# Patient Record
Sex: Female | Born: 1975 | Hispanic: No | Marital: Single | Smoking: Never smoker
Health system: Southern US, Community
[De-identification: ages and names within clinical notes are randomized; demographics above are authoritative.]

## PROBLEM LIST (undated history)

## (undated) DIAGNOSIS — E7800 Pure hypercholesterolemia, unspecified: Secondary | ICD-10-CM

## (undated) DIAGNOSIS — E119 Type 2 diabetes mellitus without complications: Secondary | ICD-10-CM

## (undated) DIAGNOSIS — J189 Pneumonia, unspecified organism: Secondary | ICD-10-CM

---

## 2000-02-28 ENCOUNTER — Ambulatory Visit (HOSPITAL_COMMUNITY): Admission: RE | Admit: 2000-02-28 | Discharge: 2000-02-28 | Payer: Self-pay | Admitting: *Deleted

## 2000-05-19 ENCOUNTER — Inpatient Hospital Stay (HOSPITAL_COMMUNITY): Admission: AD | Admit: 2000-05-19 | Discharge: 2000-05-19 | Payer: Self-pay | Admitting: *Deleted

## 2000-05-20 ENCOUNTER — Inpatient Hospital Stay (HOSPITAL_COMMUNITY): Admission: AD | Admit: 2000-05-20 | Discharge: 2000-05-20 | Payer: Self-pay | Admitting: *Deleted

## 2000-06-03 ENCOUNTER — Inpatient Hospital Stay (HOSPITAL_COMMUNITY): Admission: AD | Admit: 2000-06-03 | Discharge: 2000-06-06 | Payer: Self-pay | Admitting: Obstetrics

## 2001-01-08 ENCOUNTER — Other Ambulatory Visit: Admission: RE | Admit: 2001-01-08 | Discharge: 2001-01-08 | Payer: Self-pay | Admitting: Obstetrics and Gynecology

## 2001-02-15 ENCOUNTER — Encounter: Payer: Self-pay | Admitting: Obstetrics and Gynecology

## 2001-02-15 ENCOUNTER — Ambulatory Visit (HOSPITAL_COMMUNITY): Admission: RE | Admit: 2001-02-15 | Discharge: 2001-02-15 | Payer: Self-pay | Admitting: Obstetrics and Gynecology

## 2001-08-02 ENCOUNTER — Emergency Department (HOSPITAL_COMMUNITY): Admission: EM | Admit: 2001-08-02 | Discharge: 2001-08-02 | Payer: Self-pay

## 2001-08-02 ENCOUNTER — Encounter: Payer: Self-pay | Admitting: Emergency Medicine

## 2001-08-20 ENCOUNTER — Encounter: Admission: RE | Admit: 2001-08-20 | Discharge: 2001-09-25 | Payer: Self-pay | Admitting: Orthopaedic Surgery

## 2002-04-17 ENCOUNTER — Encounter: Payer: Self-pay | Admitting: Emergency Medicine

## 2002-04-17 ENCOUNTER — Emergency Department (HOSPITAL_COMMUNITY): Admission: EM | Admit: 2002-04-17 | Discharge: 2002-04-17 | Payer: Self-pay | Admitting: Emergency Medicine

## 2002-11-13 HISTORY — PX: TUBAL LIGATION: SHX77

## 2003-01-07 ENCOUNTER — Emergency Department (HOSPITAL_COMMUNITY): Admission: EM | Admit: 2003-01-07 | Discharge: 2003-01-08 | Payer: Self-pay | Admitting: Emergency Medicine

## 2003-01-08 ENCOUNTER — Encounter: Payer: Self-pay | Admitting: Emergency Medicine

## 2003-05-27 ENCOUNTER — Ambulatory Visit (HOSPITAL_COMMUNITY): Admission: RE | Admit: 2003-05-27 | Discharge: 2003-05-27 | Payer: Self-pay | Admitting: Obstetrics & Gynecology

## 2003-05-27 ENCOUNTER — Encounter: Payer: Self-pay | Admitting: Obstetrics & Gynecology

## 2003-07-12 ENCOUNTER — Inpatient Hospital Stay (HOSPITAL_COMMUNITY): Admission: AD | Admit: 2003-07-12 | Discharge: 2003-07-12 | Payer: Self-pay | Admitting: Obstetrics

## 2003-07-14 ENCOUNTER — Inpatient Hospital Stay (HOSPITAL_COMMUNITY): Admission: AD | Admit: 2003-07-14 | Discharge: 2003-07-17 | Payer: Self-pay | Admitting: Obstetrics

## 2011-11-14 DIAGNOSIS — J189 Pneumonia, unspecified organism: Secondary | ICD-10-CM

## 2011-11-14 HISTORY — DX: Pneumonia, unspecified organism: J18.9

## 2012-10-04 ENCOUNTER — Emergency Department (HOSPITAL_COMMUNITY)
Admission: EM | Admit: 2012-10-04 | Discharge: 2012-10-04 | Disposition: A | Discharge status: Home / Self Care | Payer: BC Managed Care – PPO | Source: Home / Self Care | Attending: Emergency Medicine | Admitting: Emergency Medicine

## 2012-10-04 ENCOUNTER — Encounter (HOSPITAL_COMMUNITY): Payer: Self-pay | Admitting: Emergency Medicine

## 2012-10-04 DIAGNOSIS — J45909 Unspecified asthma, uncomplicated: Secondary | ICD-10-CM

## 2012-10-04 HISTORY — DX: Type 2 diabetes mellitus without complications: E11.9

## 2012-10-04 HISTORY — DX: Pure hypercholesterolemia, unspecified: E78.00

## 2012-10-04 MED ORDER — ALBUTEROL SULFATE HFA 108 (90 BASE) MCG/ACT IN AERS: 1.0000 | INHALATION_SPRAY | Freq: Four times a day (QID) | RESPIRATORY_TRACT | Status: DC | PRN

## 2012-10-04 MED ORDER — PREDNISONE 20 MG PO TABS: 40.0000 mg | ORAL_TABLET | Freq: Every day | ORAL | Status: AC

## 2012-10-04 MED ORDER — PREDNISONE 20 MG PO TABS
40.0000 mg | ORAL_TABLET | Freq: Once | ORAL | Status: AC
  Administered 2012-10-04: 40 mg via ORAL

## 2012-10-04 MED ORDER — ALBUTEROL SULFATE (5 MG/ML) 0.5% IN NEBU
INHALATION_SOLUTION | RESPIRATORY_TRACT | Status: AC
  Filled 2012-10-04: qty 1

## 2012-10-04 MED ORDER — CETIRIZINE HCL 10 MG PO CAPS: 1.0000 | ORAL_CAPSULE | Freq: Every day | ORAL | Status: DC

## 2012-10-04 MED ORDER — PREDNISONE 20 MG PO TABS
ORAL_TABLET | ORAL | Status: AC
  Filled 2012-10-04: qty 2

## 2012-10-04 MED ORDER — ALBUTEROL SULFATE (5 MG/ML) 0.5% IN NEBU
5.0000 mg | INHALATION_SOLUTION | Freq: Once | RESPIRATORY_TRACT | Status: AC
  Administered 2012-10-04: 5 mg via RESPIRATORY_TRACT

## 2012-10-04 NOTE — ED Notes (Signed)
Adv pt to notify the front if her sx change while she's waiting.

## 2012-10-04 NOTE — ED Provider Notes (Signed)
History     CSN: [id number]  Arrival date & time 10/04/12  1041   First MD Initiated Contact with Patient 10/04/12 1219      Chief Complaint  Patient presents with  . URI    (Consider location/radiation/quality/duration/timing/severity/associated sxs/prior treatment) HPI Comments: Patient presents urgent care this morning after having felt sudden shortness of breath wheezing sore throat any nasal congestion. Describes a for the last 2 months he has had respiratory problems which she attended her Dr. for was diagnosed with bronchitis, she completed and antibiotics cycle at that point and said that somehow she felt much better after a week but her symptoms never went away completely. Patient presents to urgent care describing that this morning she feels as a result of whether change she started feeling short of breath and congested again to the point it was having difficulty breathing.  Patient is a 36 y.o. female presenting with URI. The history is provided by the patient.  URI The primary symptoms include cough and wheezing. Primary symptoms do not include fever, fatigue, headaches, nausea, arthralgias or rash. The current episode started more than 1 week ago. This is a recurrent problem. Progression since onset: In term attempt shortly.  The illness is not associated with chills, facial pain or sinus pressure.    Past Medical History  Diagnosis Date  . Diabetes mellitus without complication   . Hypercholesteremia     History reviewed. No pertinent past surgical history.  History reviewed. No pertinent family history.  History  Substance Use Topics  . Smoking status: Never Smoker   . Smokeless tobacco: Not on file  . Alcohol Use: No    OB History    Grav Para Term Preterm Abortions TAB SAB Ect Mult Living                  Review of Systems  Constitutional: Negative for fever, chills and fatigue.  HENT: Negative for sinus pressure.   Respiratory: Positive for cough  and wheezing.   Cardiovascular: Negative for chest pain, palpitations and leg swelling.  Gastrointestinal: Negative for nausea.  Musculoskeletal: Negative for arthralgias.  Skin: Negative for rash.  Neurological: Negative for dizziness and headaches.    Allergies  Review of patient's allergies indicates no known allergies.  Home Medications   Current Outpatient Rx  Name  Route  Sig  Dispense  Refill  . ROSUVASTATIN CALCIUM 10 MG PO TABS   Oral   Take 10 mg by mouth daily.         Marland Kitchen SITAGLIPTIN-METFORMIN HCL 50-1000 MG PO TABS   Oral   Take 1 tablet by mouth 2 (two) times daily with a meal.         . ALBUTEROL SULFATE HFA 108 (90 BASE) MCG/ACT IN AERS   Inhalation   Inhale 1-2 puffs into the lungs every 6 (six) hours as needed for wheezing or shortness of breath.   1 Inhaler   0   . CETIRIZINE HCL 10 MG PO CAPS   Oral   Take 1 capsule (10 mg total) by mouth daily.   30 capsule   1   . PREDNISONE 20 MG PO TABS   Oral   Take 2 tablets (40 mg total) by mouth daily. 2 tablets daily for 5 days   10 tablet   0     BP 114/75  Pulse 84  Temp 98.3 F (36.8 C) (Oral)  Resp 20  SpO2 98%  LMP 09/06/2012  Physical Exam  Nursing note and vitals reviewed. Constitutional: Vital signs are normal. She appears well-developed and well-nourished.  Non-toxic appearance. She does not have a sickly appearance. She does not appear ill. No distress.  HENT:  Right Ear: Tympanic membrane normal. No drainage or swelling.  Left Ear: Tympanic membrane normal.  Mouth/Throat: Uvula is midline. Posterior oropharyngeal erythema present. No oropharyngeal exudate, posterior oropharyngeal edema or tonsillar abscesses.  Pulmonary/Chest: Effort normal. No respiratory distress. She has no decreased breath sounds. She has wheezes. She has no rales. She exhibits no tenderness.    ED Course  Procedures (including critical care time)  Labs Reviewed - No data to display No results  found.   1. Reactive airway disease with wheezing       MDM  The symptoms and exam were most consistent with reactive airway disease. 5 document R. by 15-20 minutes just got is a i the provider.        [name], MD 10/04/12 [phone]

## 2012-10-04 NOTE — ED Notes (Signed)
Pt c/o cold sx x2 hours... Sx include: Headache, sore throat, chest discomfort when she takes deep breaths (inhale), SOB, nasal congestion... Denies: fevers, vomiting, nauseas, diarrhea, cough

## 2014-08-10 ENCOUNTER — Encounter (HOSPITAL_COMMUNITY): Payer: Self-pay | Admitting: Emergency Medicine

## 2014-08-10 ENCOUNTER — Emergency Department (INDEPENDENT_AMBULATORY_CARE_PROVIDER_SITE_OTHER)
Admission: EM | Admit: 2014-08-10 | Discharge: 2014-08-10 | Disposition: A | Discharge status: Home / Self Care | Payer: BC Managed Care – PPO | Source: Home / Self Care | Attending: Family Medicine | Admitting: Family Medicine

## 2014-08-10 DIAGNOSIS — R0982 Postnasal drip: Secondary | ICD-10-CM

## 2014-08-10 DIAGNOSIS — J0140 Acute pansinusitis, unspecified: Secondary | ICD-10-CM

## 2014-08-10 DIAGNOSIS — J018 Other acute sinusitis: Secondary | ICD-10-CM

## 2014-08-10 HISTORY — DX: Pneumonia, unspecified organism: J18.9

## 2014-08-10 LAB — POCT RAPID STREP A: STREPTOCOCCUS, GROUP A SCREEN (DIRECT): NEGATIVE

## 2014-08-10 MED ORDER — IPRATROPIUM BROMIDE 0.06 % NA SOLN: 2.0000 | Freq: Four times a day (QID) | NASAL | Status: DC

## 2014-08-10 MED ORDER — FLUTICASONE PROPIONATE 50 MCG/ACT NA SUSP: 2.0000 | Freq: Every day | NASAL | Status: DC

## 2014-08-10 MED ORDER — AMOXICILLIN-POT CLAVULANATE 875-125 MG PO TABS: 1.0000 | ORAL_TABLET | Freq: Two times a day (BID) | ORAL | Status: DC

## 2014-08-10 NOTE — ED Provider Notes (Signed)
CSN: [id number]     Arrival date & time 08/10/14  1138 History   First MD Initiated Contact with Patient 08/10/14 1311     Chief Complaint  Patient presents with  . Nasal Congestion  . Headache  . Sore Throat  . sore gums    (Consider location/radiation/quality/duration/timing/severity/associated sxs/prior Treatment) HPI  Cough and congestion: started 10 days ago. Associated w/ initially clear nasal discharge which is now green. Getting worse. No immprovement w/ rest and homeemade home regimens w/ honey and tea. Associated w/ sore throat, gum tenderness, chest congestion, sinus pressure and headache. No sick contacts.   Past Medical History  Diagnosis Date  . Diabetes mellitus without complication   . Hypercholesteremia   . Pneumonia 2013   Past Surgical History  Procedure Laterality Date  . Tubal ligation  2004   History reviewed. No pertinent family history. History  Substance Use Topics  . Smoking status: Never Smoker   . Smokeless tobacco: Never Used  . Alcohol Use: No   OB History   Grav Para Term Preterm Abortions TAB SAB Ect Mult Living                 Review of Systems Per HPI with all other pertinent systems negative.   Allergies  Review of patient's allergies indicates no known allergies.  Home Medications   Prior to Admission medications   Medication Sig Start Date End Date Taking? Authorizing Provider  albuterol (PROVENTIL HFA;VENTOLIN HFA) 108 (90 BASE) MCG/ACT inhaler Inhale 1-2 puffs into the lungs every 6 (six) hours as needed for wheezing or shortness of breath. 10/04/12   [name], MD  amoxicillin-clavulanate (AUGMENTIN) 875-125 MG per tablet Take 1 tablet by mouth 2 (two) times daily. 08/10/14   [name], MD  Cetirizine HCl (ZYRTEC ALLERGY) 10 MG CAPS Take 1 capsule (10 mg total) by mouth daily. 10/04/12   [name], MD  fluticasone (FLONASE) 50 MCG/ACT nasal spray Place 2 sprays into both nostrils at bedtime. 08/10/14   [name], MD   ipratropium (ATROVENT) 0.06 % nasal spray Place 2 sprays into both nostrils 4 (four) times daily. 08/10/14   [name], MD  rosuvastatin (CRESTOR) 10 MG tablet Take 10 mg by mouth daily.    Historical Provider, MD  sitaGLIPtan-metformin (JANUMET) 50-1000 MG per tablet Take 1 tablet by mouth 2 (two) times daily with a meal.    Historical Provider, MD   BP 111/74  Pulse 76  Temp(Src) 99 F (37.2 C) (Oral)  Resp 18  SpO2 98%  LMP 07/12/2014 Physical Exam  Constitutional: She is oriented to person, place, and time. She appears well-developed and well-nourished. No distress.  HENT:  Frontal and maxillary sinuses ttp Pharyngeal cobblestoning  Eyes: EOM are normal. Pupils are equal, round, and reactive to light.  Neck: Normal range of motion. Neck supple.  Cardiovascular: Normal rate, normal heart sounds and intact distal pulses.   No murmur heard. Pulmonary/Chest: Effort normal and breath sounds normal. No respiratory distress. She has no wheezes. She has no rales. She exhibits no tenderness.  Abdominal: Soft. She exhibits no distension.  Musculoskeletal: Normal range of motion. She exhibits no tenderness.  Neurological: She is alert and oriented to person, place, and time.  Skin: Skin is warm. She is not diaphoretic.  Psychiatric: She has a normal mood and affect. Thought content normal.    ED Course  Procedures (including critical care time) Labs Review Labs Reviewed  POCT RAPID STREP A ([facility] ONLY)    Imaging Review No results found.   MDM   1. Acute pansinusitis, recurrence not specified   2. Post-nasal drip    Augmentin Flonase Nasal atrovent Ibuprofen Mucinex.   Precautions given and all questions answered  Shelly Flatten, MD Family Medicine 08/10/2014, 2:05 PM      [name], MD 08/10/14 [phone]

## 2014-08-10 NOTE — ED Notes (Signed)
Pt c/o runny nose, h/a, sore throat, and sore gums lasting 5 days with no relief.  Pt states she has 10/10 chest pressure from the cold.  Pt has tried only Benadryl for her symptoms.

## 2014-08-10 NOTE — Discharge Instructions (Signed)
You l;ikiely have sinusitis that is caused by a bacterial infection. This will take antibiotics to clear Please start the flonase and nasal atrovent for nasal congestion Please consider using  of ibuprofen for fever adn pain relief and an over the counter cold medicine such as mucinex  Sinusitis Sinusitis is redness, soreness, and inflammation of the paranasal sinuses. Paranasal sinuses are air pockets within the bones of your face (beneath the eyes, the middle of the forehead, or above the eyes). In healthy paranasal sinuses, mucus is able to drain out, and air is able to circulate through them by way of your nose. However, when your paranasal sinuses are inflamed, mucus and air can become trapped. This can allow bacteria and other germs to grow and cause infection. Sinusitis can develop quickly and last only a short time (acute) or continue over a long period (chronic). Sinusitis that lasts for more than 12 weeks is considered chronic.  CAUSES  Causes of sinusitis include:  Allergies.  Structural abnormalities, such as displacement of the cartilage that separates your nostrils (deviated septum), which can decrease the air flow through your nose and sinuses and affect sinus drainage.  Functional abnormalities, such as when the small hairs (cilia) that line your sinuses and help remove mucus do not work properly or are not present. SIGNS AND SYMPTOMS  Symptoms of acute and chronic sinusitis are the same. The primary symptoms are pain and pressure around the affected sinuses. Other symptoms include:  Upper toothache.  Earache.  Headache.  Bad breath.  Decreased sense of smell and taste.  A cough, which worsens when you are lying flat.  Fatigue.  Fever.  Thick drainage from your nose, which often is green and may contain pus (purulent).  Swelling and warmth over the affected sinuses. DIAGNOSIS  Your health care provider will perform a physical exam. During the exam, your  health care provider may:  Look in your nose for signs of abnormal growths in your nostrils (nasal polyps).  Tap over the affected sinus to check for signs of infection.  View the inside of your sinuses (endoscopy) using an imaging device that has a light attached (endoscope). If your health care provider suspects that you have chronic sinusitis, one or more of the following tests may be recommended:  Allergy tests.  Nasal culture. A sample of mucus is taken from your nose, sent to a lab, and screened for bacteria.  Nasal cytology. A sample of mucus is taken from your nose and examined by your health care provider to determine if your sinusitis is related to an allergy. TREATMENT  Most cases of acute sinusitis are related to a viral infection and will resolve on their own within 10 days. Sometimes medicines are prescribed to help relieve symptoms (pain medicine, decongestants, nasal steroid sprays, or saline sprays).  However, for sinusitis related to a bacterial infection, your health care provider will prescribe antibiotic medicines. These are medicines that will help kill the bacteria causing the infection.  Rarely, sinusitis is caused by a fungal infection. In theses cases, your health care provider will prescribe antifungal medicine. For some cases of chronic sinusitis, surgery is needed. Generally, these are cases in which sinusitis recurs more than 3 times per year, despite other treatments. HOME CARE INSTRUCTIONS   Drink plenty of water. Water helps thin the mucus so your sinuses can drain more easily.  Use a humidifier.  Inhale steam 3 to 4 times a day (for example, sit in the bathroom with the  shower running).  Apply a warm, moist washcloth to your face 3 to 4 times a day, or as directed by your health care provider.  Use saline nasal sprays to help moisten and clean your sinuses.  Take medicines only as directed by your health care provider.  If you were prescribed either  an antibiotic or antifungal medicine, finish it all even if you start to feel better. SEEK IMMEDIATE MEDICAL CARE IF:  You have increasing pain or severe headaches.  You have nausea, vomiting, or drowsiness.  You have swelling around your face.  You have vision problems.  You have a stiff neck.  You have difficulty breathing. MAKE SURE YOU:   Understand these instructions.  Will watch your condition.  Will get help right away if you are not doing well or get worse. Document Released: 10/30/2005 Document Revised: 03/16/2014 Document Reviewed: 11/14/2011 [facility] Patient Information 2015 [place], [place]. This information is not intended to replace advice given to you by your health care provider. Make sure you discuss any questions you have with your health care provider.

## 2014-08-12 LAB — CULTURE, GROUP A STREP

## 2016-09-04 ENCOUNTER — Ambulatory Visit (INDEPENDENT_AMBULATORY_CARE_PROVIDER_SITE_OTHER): Payer: BLUE CROSS/BLUE SHIELD

## 2016-09-04 ENCOUNTER — Encounter (HOSPITAL_COMMUNITY): Payer: Self-pay | Admitting: Emergency Medicine

## 2016-09-04 ENCOUNTER — Ambulatory Visit (HOSPITAL_COMMUNITY)
Admission: EM | Admit: 2016-09-04 | Discharge: 2016-09-04 | Disposition: A | Discharge status: Home / Self Care | Payer: BLUE CROSS/BLUE SHIELD | Attending: Family Medicine | Admitting: Family Medicine

## 2016-09-04 DIAGNOSIS — S93601A Unspecified sprain of right foot, initial encounter: Secondary | ICD-10-CM

## 2016-09-04 NOTE — ED Notes (Signed)
Patient orthopedic order needed clarifying with dr Milus Glazierlauenstein.

## 2016-09-04 NOTE — ED Triage Notes (Signed)
[facility] a [facility] of right foot pain that started last night. The patient reported that she stepped into a hole and now the top of her foot is swollen and painful.

## 2016-09-04 NOTE — ED Provider Notes (Signed)
[facility]    CSN: [id number] Arrival date & time: 09/04/16  1018     History   Chief Complaint Chief Complaint  Patient presents with  . Foot Pain    HPI Lisa Patrick is a 40 y.o. female.   Is a 40 year old woman who comes in with right foot pain after stepping in a hole last night. She applied ice and the swelling went down but she's been having difficulty walking now. 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  She's able to bend her toes without difficulty. She's had no ecchymosis. She has no history of injury to the right foot before last night.      Past Medical History:  Diagnosis Date  . Diabetes mellitus without complication (HCC)   . Hypercholesteremia   . Pneumonia 2013    There are no active problems to display for this patient.   Past Surgical History:  Procedure Laterality Date  . TUBAL LIGATION  2004    OB History    No data available       Home Medications    Prior to Admission medications   Medication Sig Start Date End Date Taking? Authorizing Provider  albuterol (PROVENTIL HFA;VENTOLIN HFA) 108 (90 BASE) MCG/ACT inhaler Inhale 1-2 puffs into the lungs every 6 (six) hours as needed for wheezing or shortness of breath. 10/04/12   Lisa Molly, MD  amoxicillin-clavulanate (AUGMENTIN) 875-125 MG per tablet Take 1 tablet by mouth 2 (two) times daily. 08/10/14   Lisa Rocks, MD  Cetirizine HCl (ZYRTEC ALLERGY) 10 MG CAPS Take 1 capsule (10 mg total) by mouth daily. 10/04/12   Lisa Molly, MD  fluticasone (FLONASE) 50 MCG/ACT nasal spray Place 2 sprays into both nostrils at bedtime. 08/10/14   Lisa Rocks, MD  ipratropium (ATROVENT) 0.06 % nasal spray Place 2 sprays into both nostrils 4 (four) times daily. 08/10/14   Lisa Rocks, MD  rosuvastatin (CRESTOR) 10 MG tablet Take 10 mg by mouth daily.    Historical Provider, MD  sitaGLIPtan-metformin (JANUMET) 50-1000 MG per tablet Take 1 tablet by mouth 2 (two)  times daily with a meal.    Historical Provider, MD    Family History History reviewed. No pertinent family history.  Social History Social History  Substance Use Topics  . Smoking status: Never Smoker  . Smokeless tobacco: Never Used  . Alcohol use No     Allergies   Review of patient's allergies indicates no known allergies.   Review of Systems Review of Systems  Constitutional: Negative.   HENT: Negative.   Eyes: Negative.   Respiratory: Negative.   Cardiovascular: Negative.   Musculoskeletal: Positive for joint swelling.     Physical Exam Triage Vital Signs ED Triage Vitals  Enc Vitals Group     BP 09/04/16 1043 129/67     Pulse Rate 09/04/16 1043 73     Resp 09/04/16 1043 18     Temp 09/04/16 1043 98.9 F (37.2 C)     Temp Source 09/04/16 1043 Oral     SpO2 09/04/16 1043 100 %     Weight --      Height --      Head Circumference --      Peak Flow --      Pain Score 09/04/16 1048 8     Pain Loc --      Pain Edu? --      Excl. in GC? --  No data found.   Updated Vital Signs BP 129/67 (BP Location: Right Arm)   Pulse 73   Temp 98.9 F (37.2 C) (Oral)   Resp 18   LMP 08/05/2016 (Exact Date) Comment: tubal ligation  SpO2 100%  Physical Exam  Constitutional: She is oriented to person, place, and time. She appears well-developed and well-nourished.  Musculoskeletal: Normal range of motion.  Patient has no bony tenderness but does have mild swelling over the dorsal right foot in the mid M TP joints.  Neurological: She is alert and oriented to person, place, and time. She exhibits normal muscle tone. Coordination normal.  Skin: Skin is warm and dry. No rash noted.  Nursing note and vitals reviewed.    UC Treatments / Results  Labs (all labs ordered are listed, but only abnormal results are displayed) Labs Reviewed - No data to display  EKG  EKG Interpretation None       Radiology Dg Ankle Complete Right  Result Date:  09/04/2016 CLINICAL DATA:  Status post fall off site last night. EXAM: RIGHT ANKLE - COMPLETE 3+ VIEW COMPARISON:  None. FINDINGS: There is no evidence of fracture, dislocation, or joint effusion. There is a small plantar calcaneal spur. There are enthesopathic changes of the Achilles tendon insertion. There is no evidence of arthropathy or other focal bone abnormality. Soft tissues are unremarkable. IMPRESSION: No acute osseous injury of the right ankle. Electronically Signed   By: Elige KoHetal  Patel   On: 09/04/2016 11:18   Dg Foot Complete Right  Result Date: 09/04/2016 CLINICAL DATA:  Lisa Patrick outside last night after stepping in hole and twisting right foot and ankle. Pain with walking. EXAM: RIGHT FOOT COMPLETE - 3+ VIEW COMPARISON:  Down FINDINGS: There is no evidence of fracture or dislocation. There is no evidence of arthropathy or other focal bone abnormality. Soft tissues are unremarkable. IMPRESSION: Negative. Electronically Signed   By: Lisa Patrick  Mansell M.D.   On: 09/04/2016 11:16    Procedures Procedures (including critical care time)  Medications Ordered in UC Medications - No data to display   Initial Impression / Assessment and Plan / UC Course  I have reviewed the triage vital signs and the nursing notes.  Pertinent labs & imaging results that were available during my care of the patient were reviewed by me and considered in my medical decision making (see chart for details).  Clinical Course    Final Clinical Impressions(s) / UC Diagnoses   Final diagnoses:  Sprain of right foot, initial encounter    New Prescriptions New Prescriptions   No medications on file     Elvina SidleKurt Annelle Behrendt, MD 09/04/16 1129

## 2016-09-04 NOTE — ED Triage Notes (Signed)
Patient reports right foot pain.  Patient was stepping backwards, stepped into a hole.  Reports stepping into a hole that toes and foot bent upward toward leg.  Pedal pulses 2 plus.  No numbness or tingling.  Some swelling, no bruising.  Patient complains of 8/10 pain

## 2016-09-04 NOTE — Discharge Instructions (Signed)
Expect that you forwarded get better over the next 4-5 days. If the pain persists, see your personal doctor or return here.  Keep foot elevated, use the foot splint, and use ibuprofen as directed for the next couple days to help the healing.

## 2016-10-24 ENCOUNTER — Ambulatory Visit (INDEPENDENT_AMBULATORY_CARE_PROVIDER_SITE_OTHER): Payer: BLUE CROSS/BLUE SHIELD

## 2016-10-24 ENCOUNTER — Ambulatory Visit (HOSPITAL_COMMUNITY)
Admission: EM | Admit: 2016-10-24 | Discharge: 2016-10-24 | Disposition: A | Discharge status: Home / Self Care | Payer: BLUE CROSS/BLUE SHIELD | Attending: Emergency Medicine | Admitting: Emergency Medicine

## 2016-10-24 ENCOUNTER — Encounter (HOSPITAL_COMMUNITY): Payer: Self-pay | Admitting: Family Medicine

## 2016-10-24 DIAGNOSIS — J189 Pneumonia, unspecified organism: Secondary | ICD-10-CM

## 2016-10-24 DIAGNOSIS — J181 Lobar pneumonia, unspecified organism: Secondary | ICD-10-CM | POA: Diagnosis not present

## 2016-10-24 MED ORDER — HYDROCOD POLST-CPM POLST ER 10-8 MG/5ML PO SUER: 5.0000 mL | Freq: Two times a day (BID) | ORAL | 0 refills | Status: DC | PRN

## 2016-10-24 MED ORDER — AZITHROMYCIN 250 MG PO TABS
250.0000 mg | ORAL_TABLET | Freq: Every day | ORAL | 0 refills | Status: DC
Start: 2016-10-24 — End: 2021-06-21

## 2016-10-24 MED ORDER — AEROCHAMBER PLUS MISC: 2 refills | Status: DC

## 2016-10-24 MED ORDER — ALBUTEROL SULFATE HFA 108 (90 BASE) MCG/ACT IN AERS: 2.0000 | INHALATION_SPRAY | RESPIRATORY_TRACT | 0 refills | Status: DC | PRN

## 2016-10-24 NOTE — ED Triage Notes (Signed)
Pt here for productive cough and congestion. sts x 1 week. sts taking mucinex and not better.

## 2016-10-24 NOTE — Discharge Instructions (Signed)
2 puffs from your albuterol inhaler with a spacer every 4-6 hours as needed for coughing, wheezing, shortness of breath. 800 mg ibuprofen with 1 g of Tylenol up to 3 times a day for body aches and chest soreness. Finish the antibiotics, even if you feel better. The cough syrup will help you sleep at night. Go to the ER if you get worse.

## 2016-10-24 NOTE — ED Provider Notes (Signed)
HPI  SUBJECTIVE:  Lisa Patrick is a 40 y.o. female who presents with cough occasionally productive of greenish sputum, wheezing, shortness of breath, chest soreness during and after coughing. She reports nasal congestion, rhinorrhea. This is been present for one week. She reports feeling "hot" but has no documented fevers at home. No other chest pain, allergy type symptoms, GERD symptoms. No antipyretic in the last 6-8 hours. She states that she was getting better, but then got acutely worse. She tried Benadryl, Maalox, and a "[place] flu medicine". Symptoms worse with going unclear. No alleviating factors. She states that she is sleeping okay at night because she is taking Benadryl. She has a past medical history of diabetes for which she takes Janumet.  No history of asthma, emphysema, COPD, smoking. No history of hypertension, CHF. LMP: Yesterday. Denies possibility of being pregnant. PND: [facility].     Past Medical History:  Diagnosis Date  . Diabetes mellitus without complication (HCC)   . Hypercholesteremia   . Pneumonia 2013    Past Surgical History:  Procedure Laterality Date  . TUBAL LIGATION  2004    History reviewed. No pertinent family history.  Social History  Substance Use Topics  . Smoking status: Never Smoker  . Smokeless tobacco: Never Used  . Alcohol use No    No current facility-administered medications for this encounter.   Current Outpatient Prescriptions:  .  albuterol (PROVENTIL HFA;VENTOLIN HFA) 108 (90 Base) MCG/ACT inhaler, Inhale 2 puffs into the lungs every 4 (four) hours as needed for wheezing or shortness of breath. Dispense with aerochamber, Disp: 1 Inhaler, Rfl: 0 .  azithromycin (ZITHROMAX) 250 MG tablet, Take 1 tablet (250 mg total) by mouth daily. 2 tabs po on day 1, 1 tab po on days 2-5, Disp: 6 tablet, Rfl: 0 .  Cetirizine HCl (ZYRTEC ALLERGY) 10 MG CAPS, Take 1 capsule (10 mg total) by mouth daily., Disp: 30 capsule, Rfl: 1 .   chlorpheniramine-HYDROcodone (TUSSIONEX PENNKINETIC ER) 10-8 MG/5ML SUER, Take 5 mLs by mouth every 12 (twelve) hours as needed for cough., Disp: 120 mL, Rfl: 0 .  fluticasone (FLONASE) 50 MCG/ACT nasal spray, Place 2 sprays into both nostrils at bedtime., Disp: 16 g, Rfl: 0 .  ipratropium (ATROVENT) 0.06 % nasal spray, Place 2 sprays into both nostrils 4 (four) times daily., Disp: 15 mL, Rfl: 12 .  rosuvastatin (CRESTOR) 10 MG tablet, Take 10 mg by mouth daily., Disp: , Rfl:  .  sitaGLIPtan-metformin (JANUMET) 50-1000 MG per tablet, Take 1 tablet by mouth 2 (two) times daily with a meal., Disp: , Rfl:  .  Spacer/Aero-Holding Chambers (AEROCHAMBER PLUS) inhaler, Use as instructed, Disp: 1 each, Rfl: 2  No Known Allergies   ROS  As noted in HPI.   Physical Exam  BP 120/58   Pulse 89   Temp 98.4 F (36.9 C)   Resp 20   LMP 10/22/2016   SpO2 98%   Constitutional: Well developed, well nourished, no acute distress. Positive cough. Eyes:  EOMI, conjunctiva normal bilaterally HENT: Normocephalic, atraumatic,mucus membranes moist. Positive nasal congestion. Cobblestoning oropharynx. No obvious postnasal drip. Respiratory: Normal inspiratory effort lungs clear bilaterally, good air movement. No chest wall tenderness. Cardiovascular: Normal rate, regular rhythm no murmurs rubs gallops. GI: nondistended skin: No rash, skin intact Musculoskeletal: no deformities Neurologic: Alert & oriented x 3, no focal neuro deficits Psychiatric: Speech and behavior appropriate   ED Course   Medications - No data to display  Orders Placed This Encounter  Procedures  . DG Chest 2 View    Standing Status:   Standing    Number of Occurrences:   1    Order Specific Question:   Reason for Exam (SYMPTOM  OR DIAGNOSIS REQUIRED)    Answer:   cough    No results found for this or any previous visit (from the past 24 hour(s)). Dg Chest 2 View  Result Date: 10/24/2016 CLINICAL DATA:  Cough and  fatigue for 1 week EXAM: CHEST  2 VIEW COMPARISON:  None. FINDINGS: Cardiac shadow is within normal limits. The lungs are well aerated bilaterally. On the lateral projection there is a somewhat globular density identified projecting over the lower lobe which appears to lie in the left retrocardiac region. IMPRESSION: Somewhat bullous density projecting in the left lower lobe best seen on the lateral projection. This may be related to some mild infiltrate. No other focal abnormality is noted Electronically Signed   By: Alcide CleverMark  Lukens M.D.   On: 10/24/2016 12:04    ED Clinical Impression  Community acquired pneumonia of left lower lobe of lung [facility](HCC)  ED Assessment/Plan  Reviewed chest x-ray. Globular density over the left lower lobe may be related to some mild infiltrate. No other focal abnormalities. See radiology report for details.  Presentation most consistent with CAP.  Plan to sent home with albuterol with spacer, azithromycin, cough syrup. No steroids as patient is a diabetic. Follow-up with PMD as needed. To the ER if gets worse.  Discussed imaging, MDM, plan and followup with patient. Discussed sn/sx that should prompt return to the ED. Patient  agrees with plan.   Meds ordered this encounter  Medications  . albuterol (PROVENTIL HFA;VENTOLIN HFA) 108 (90 Base) MCG/ACT inhaler    Sig: Inhale 2 puffs into the lungs every 4 (four) hours as needed for wheezing or shortness of breath. Dispense with aerochamber    Dispense:  1 Inhaler    Refill:  0  . azithromycin (ZITHROMAX) 250 MG tablet    Sig: Take 1 tablet (250 mg total) by mouth daily. 2 tabs po on day 1, 1 tab po on days 2-5    Dispense:  6 tablet    Refill:  0  . chlorpheniramine-HYDROcodone (TUSSIONEX PENNKINETIC ER) 10-8 MG/5ML SUER    Sig: Take 5 mLs by mouth every 12 (twelve) hours as needed for cough.    Dispense:  120 mL    Refill:  0  . Spacer/Aero-Holding Chambers (AEROCHAMBER PLUS) inhaler    Sig: Use as instructed     Dispense:  1 each    Refill:  2    *This clinic note was created using Dragon dictation software. Therefore, there may be occasional mistakes despite careful proofreading.  ?   Domenick GongAshley Nastashia Gallo, MD 10/24/16 1228

## 2017-04-05 IMAGING — DX DG CHEST 2V
2 series · 2 of 2 positions shown · non-contrast
Comparison: None.

CLINICAL DATA: Cough and fatigue for 1 week

EXAM:
CHEST  2 VIEW

[chest pa]
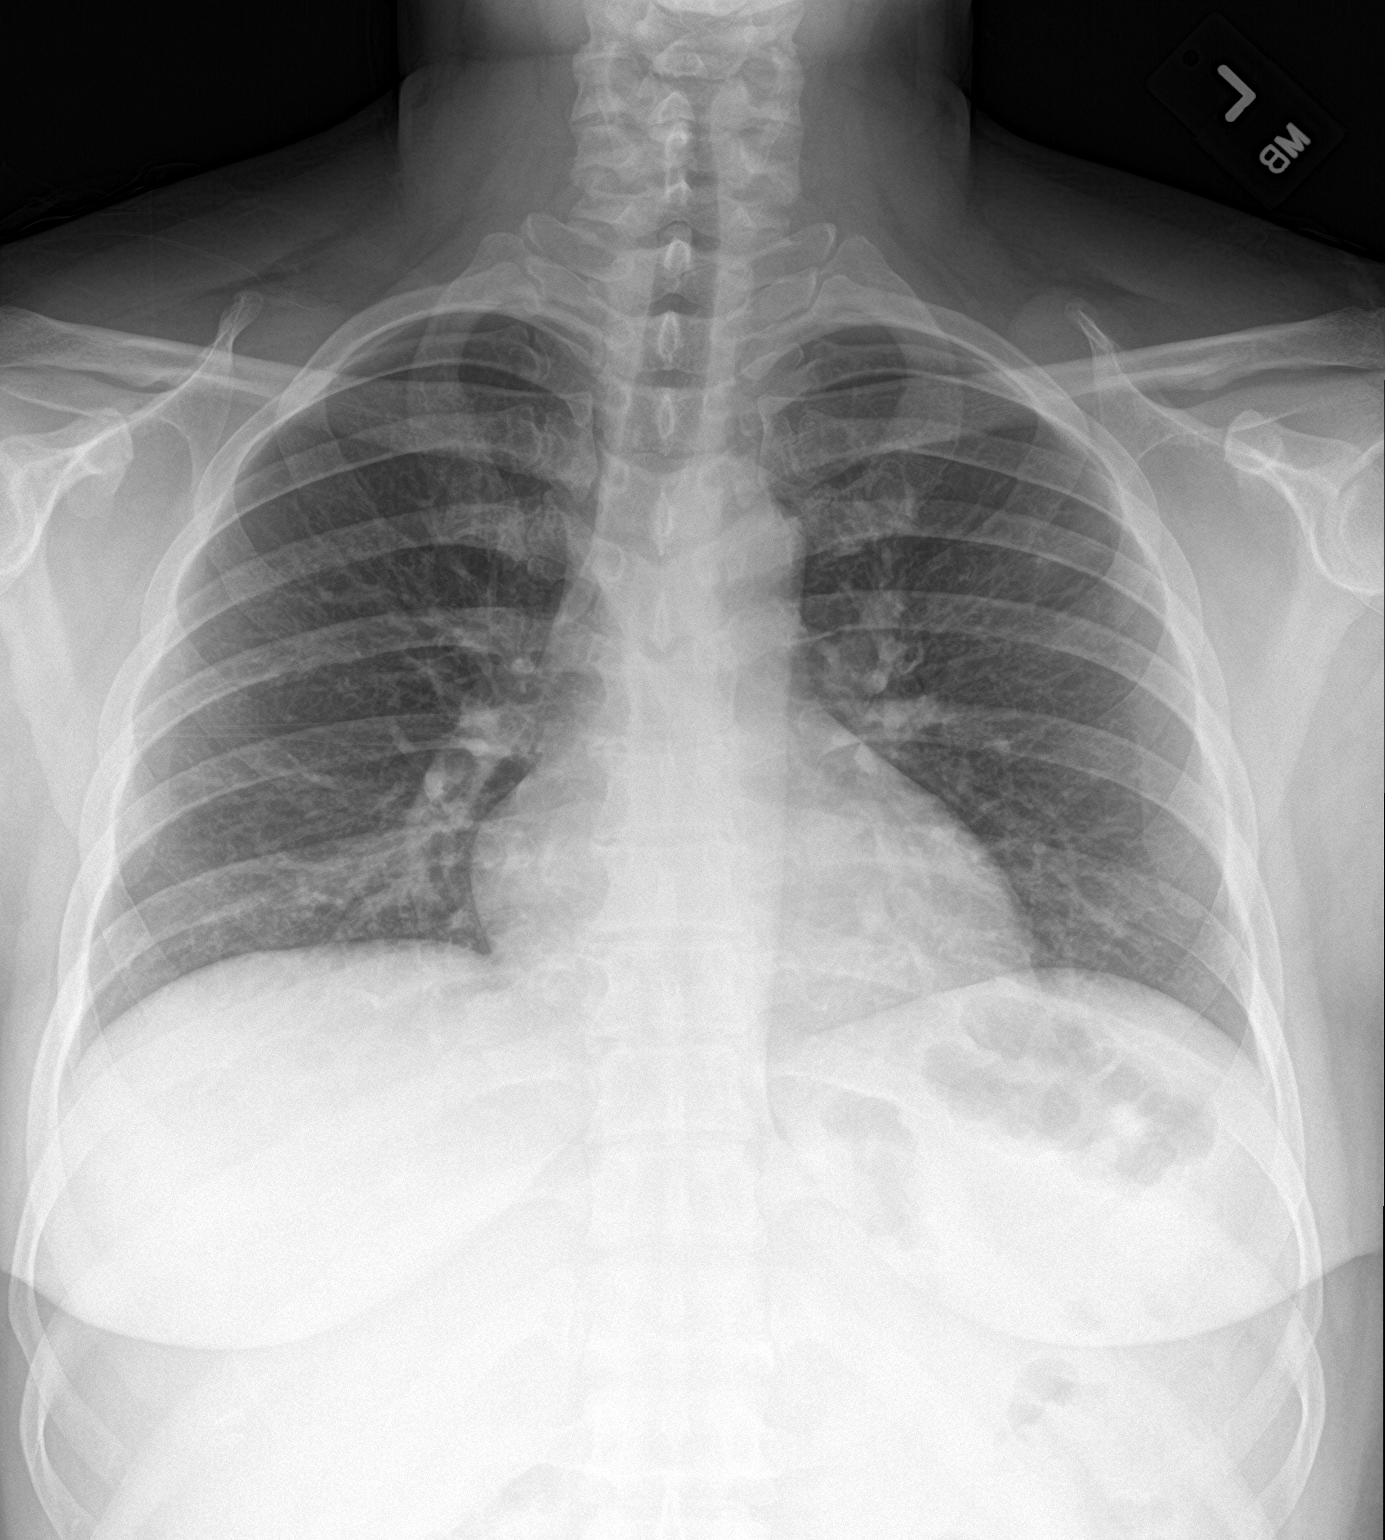

[chest lat]
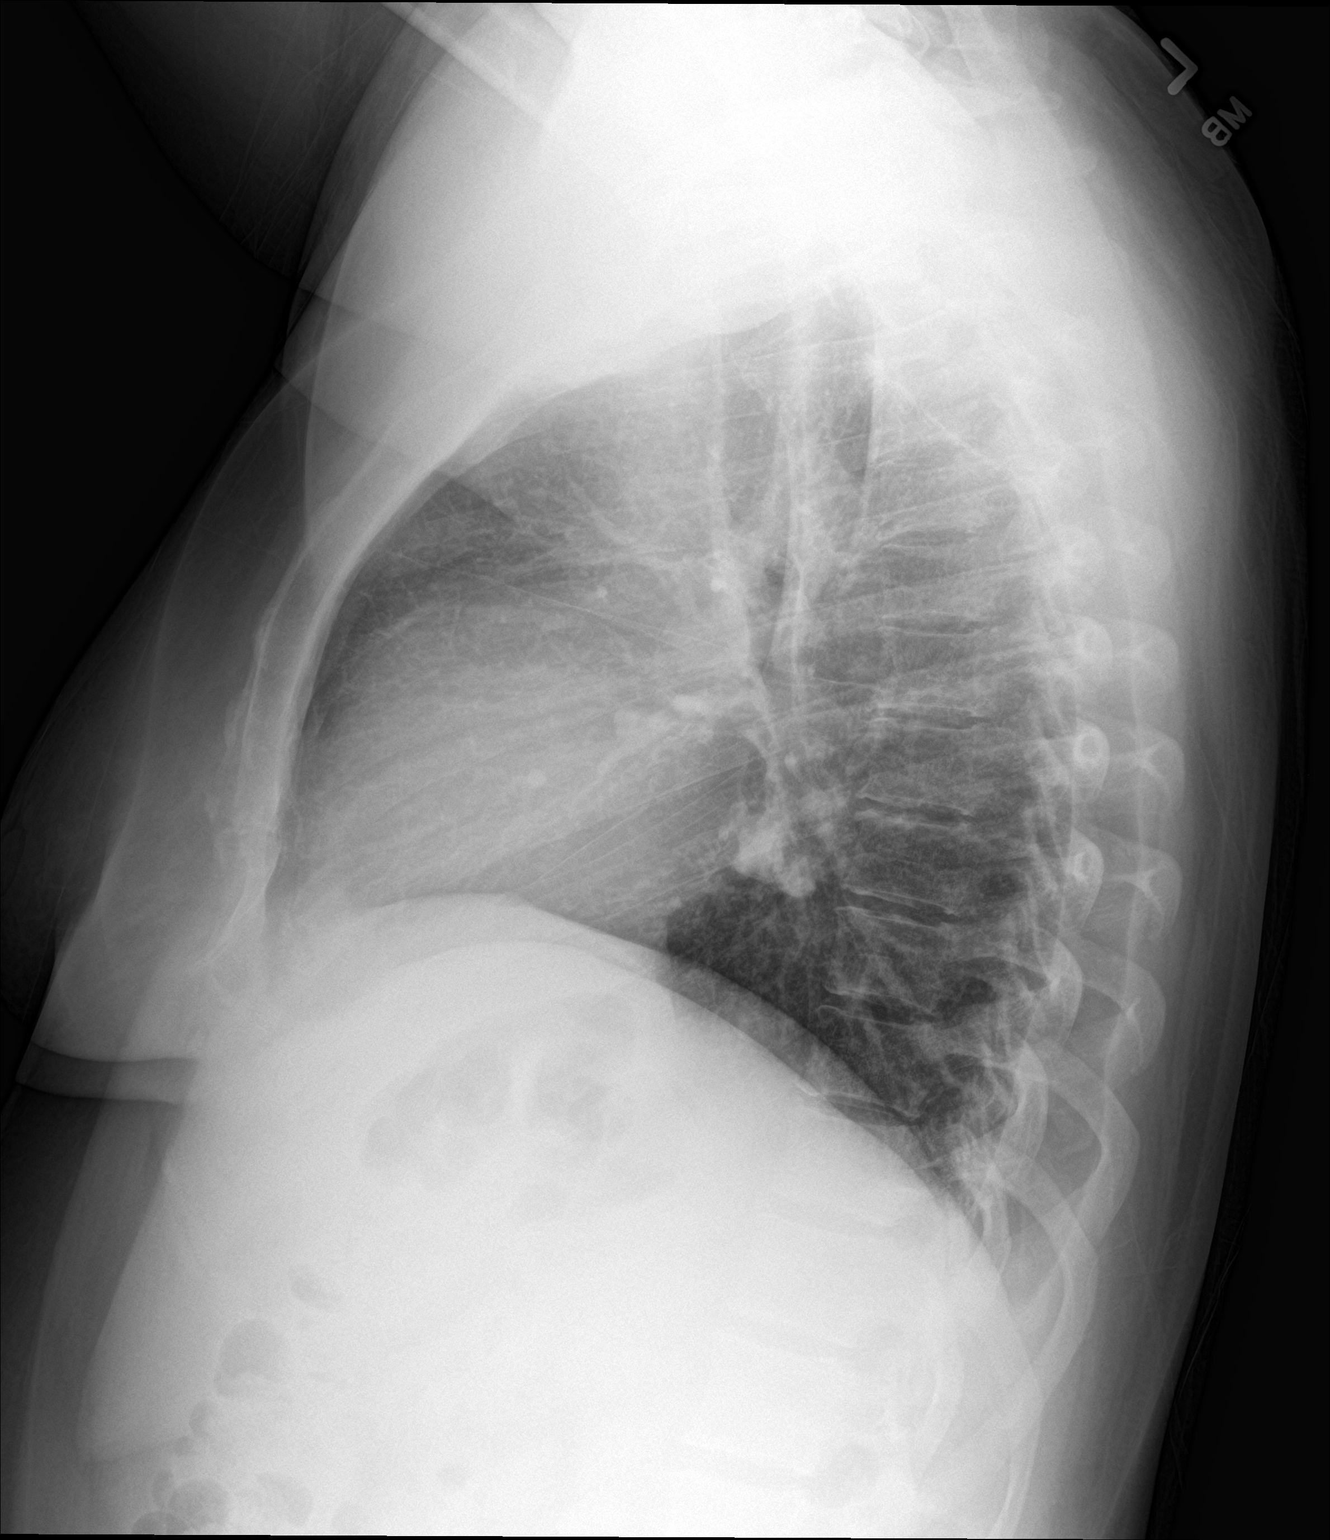

[2 of 2 positions shown; findings below may reference images not displayed]

FINDINGS: Cardiac shadow is within normal limits. The lungs are well aerated
bilaterally. On the lateral projection there is a somewhat globular
density identified projecting over the lower lobe which appears to
lie in the left retrocardiac region.
IMPRESSION: Somewhat bullous density projecting in the left lower lobe best seen
on the lateral projection. This may be related to some mild
infiltrate. No other focal abnormality is noted

## 2019-01-29 DIAGNOSIS — J101 Influenza due to other identified influenza virus with other respiratory manifestations: Secondary | ICD-10-CM | POA: Diagnosis not present

## 2019-01-29 DIAGNOSIS — E1165 Type 2 diabetes mellitus with hyperglycemia: Secondary | ICD-10-CM | POA: Diagnosis not present

## 2019-01-29 DIAGNOSIS — J069 Acute upper respiratory infection, unspecified: Secondary | ICD-10-CM | POA: Diagnosis not present

## 2019-01-29 DIAGNOSIS — J302 Other seasonal allergic rhinitis: Secondary | ICD-10-CM | POA: Diagnosis not present

## 2019-01-29 DIAGNOSIS — N390 Urinary tract infection, site not specified: Secondary | ICD-10-CM | POA: Diagnosis not present

## 2019-05-23 DIAGNOSIS — Z79899 Other long term (current) drug therapy: Secondary | ICD-10-CM | POA: Diagnosis not present

## 2019-05-23 DIAGNOSIS — J029 Acute pharyngitis, unspecified: Secondary | ICD-10-CM | POA: Diagnosis not present

## 2019-05-23 DIAGNOSIS — Z7984 Long term (current) use of oral hypoglycemic drugs: Secondary | ICD-10-CM | POA: Diagnosis not present

## 2019-05-23 DIAGNOSIS — R05 Cough: Secondary | ICD-10-CM | POA: Diagnosis not present

## 2019-05-23 DIAGNOSIS — E119 Type 2 diabetes mellitus without complications: Secondary | ICD-10-CM | POA: Diagnosis not present

## 2019-05-23 DIAGNOSIS — Z87891 Personal history of nicotine dependence: Secondary | ICD-10-CM | POA: Diagnosis not present

## 2019-05-23 DIAGNOSIS — R110 Nausea: Secondary | ICD-10-CM | POA: Diagnosis not present

## 2019-05-23 DIAGNOSIS — U071 COVID-19: Secondary | ICD-10-CM | POA: Diagnosis not present

## 2020-08-15 DIAGNOSIS — M542 Cervicalgia: Secondary | ICD-10-CM | POA: Diagnosis not present

## 2020-12-10 ENCOUNTER — Encounter (HOSPITAL_COMMUNITY): Payer: Self-pay

## 2020-12-10 ENCOUNTER — Ambulatory Visit (HOSPITAL_COMMUNITY)
Admission: RE | Admit: 2020-12-10 | Discharge: 2020-12-10 | Disposition: A | Discharge status: Home / Self Care | Payer: BC Managed Care – PPO | Source: Ambulatory Visit | Attending: Student | Admitting: Student

## 2020-12-10 ENCOUNTER — Other Ambulatory Visit: Payer: Self-pay

## 2020-12-10 VITALS — BP 115/48 | HR 97 | Temp 98.2°F | Resp 17

## 2020-12-10 DIAGNOSIS — B373 Candidiasis of vulva and vagina: Secondary | ICD-10-CM | POA: Insufficient documentation

## 2020-12-10 DIAGNOSIS — Z113 Encounter for screening for infections with a predominantly sexual mode of transmission: Secondary | ICD-10-CM | POA: Insufficient documentation

## 2020-12-10 DIAGNOSIS — M549 Dorsalgia, unspecified: Secondary | ICD-10-CM | POA: Diagnosis not present

## 2020-12-10 DIAGNOSIS — R109 Unspecified abdominal pain: Secondary | ICD-10-CM | POA: Diagnosis not present

## 2020-12-10 DIAGNOSIS — N898 Other specified noninflammatory disorders of vagina: Secondary | ICD-10-CM

## 2020-12-10 DIAGNOSIS — M545 Low back pain, unspecified: Secondary | ICD-10-CM | POA: Diagnosis not present

## 2020-12-10 DIAGNOSIS — Z3202 Encounter for pregnancy test, result negative: Secondary | ICD-10-CM

## 2020-12-10 DIAGNOSIS — R300 Dysuria: Secondary | ICD-10-CM

## 2020-12-10 DIAGNOSIS — B3731 Acute candidiasis of vulva and vagina: Secondary | ICD-10-CM

## 2020-12-10 LAB — POCT URINALYSIS DIPSTICK, ED / UC
Bilirubin Urine: NEGATIVE
Glucose, UA: >=1000 mg/dL — AB
Hgb urine dipstick: NEGATIVE
Ketones, ur: 15 mg/dL — AB
Leukocytes,Ua: NEGATIVE
Nitrite: NEGATIVE
Protein, ur: NEGATIVE mg/dL
Specific Gravity, Urine: 1.015 (ref 1.005–1.030)
Urobilinogen, UA: 0.2 mg/dL (ref 0.0–1.0)
pH: 5.5 (ref 5.0–8.0)

## 2020-12-10 LAB — POC URINE PREG, ED: Preg Test, Ur: NEGATIVE

## 2020-12-10 MED ORDER — BORIC ACID VAGINAL 600 MG VA SUPP: 1.0000 | Freq: Every day | VAGINAL | 0 refills | Status: DC

## 2020-12-10 MED ORDER — FLUCONAZOLE 150 MG PO TABS: 150.0000 mg | ORAL_TABLET | Freq: Every day | ORAL | 0 refills | Status: DC

## 2020-12-10 NOTE — ED Provider Notes (Signed)
[facility]    CSN: [id number] Arrival date & time: 12/10/20  1100      History   Chief Complaint Chief Complaint  Patient presents with  . Vaginal Itching  . Dysuria  . Back Pain  . Abdominal Pain    HPI Lisa Patrick is a 45 y.o. female presenting for vaginal itching, dysuria, frequency, intermittent crampy back pain, and  Intermittent crampy lower back pain R>L x2 days. Denies hematuria,urgency,  n/v/d, fevers/chills, abdnormal vaginal discharge. Denies STI risk.  States her period is due in 3 days.  HPI  History reviewed. No pertinent past medical history.  There are no problems to display for this patient.   History reviewed. No pertinent surgical history.  OB History   No obstetric history on file.      Home Medications    Prior to Admission medications   Medication Sig Start Date End Date Taking? Authorizing Provider  Boric Acid Vaginal 600 MG SUPP Place 1 suppository vaginally daily. 12/10/20  Yes Rhys Martini, PA-C  fluconazole (DIFLUCAN) 150 MG tablet Take 1 tablet (150 mg total) by mouth daily. Take 1 pill today. Take the second pill in 3 days if your symptoms return/persist. 12/10/20  Yes Rhys Martini, PA-C    Family History History reviewed. No pertinent family history.  Social History Social History   Tobacco Use  . Smoking status: Never Smoker  . Smokeless tobacco: Never Used     Allergies   Patient has no known allergies.   Review of Systems Review of Systems  Constitutional: Negative for appetite change, chills, diaphoresis and fever.  Respiratory: Negative for shortness of breath.   Cardiovascular: Negative for chest pain.  Gastrointestinal: Negative for abdominal pain, blood in stool, constipation, diarrhea, nausea and vomiting.  Genitourinary: Positive for dysuria, flank pain and frequency. Negative for decreased urine volume, difficulty urinating, genital sores, hematuria, menstrual problem, pelvic pain, urgency,  vaginal bleeding, vaginal discharge and vaginal pain.  Musculoskeletal: Negative for back pain.  Neurological: Negative for dizziness, weakness and light-headedness.  All other systems reviewed and are negative.    Physical Exam Triage Vital Signs ED Triage Vitals  Enc Vitals Group     BP 12/10/20 1136 (!) 115/48     Pulse Rate 12/10/20 1136 97     Resp 12/10/20 1136 17     Temp 12/10/20 1136 98.2 F (36.8 C)     Temp Source 12/10/20 1136 Oral     SpO2 12/10/20 1136 99 %     Weight --      Height --      Head Circumference --      Peak Flow --      Pain Score 12/10/20 1131 6     Pain Loc --      Pain Edu? --      Excl. in GC? --    No data found.  Updated Vital Signs BP (!) 115/48 (BP Location: Right Arm)   Pulse 97   Temp 98.2 F (36.8 C) (Oral)   Resp 17   LMP 12/06/2020 (Approximate)   SpO2 99%   Visual Acuity Right Eye Distance:   Left Eye Distance:   Bilateral Distance:    Right Eye Near:   Left Eye Near:    Bilateral Near:     Physical Exam Vitals reviewed.  Constitutional:      General: She is not in acute distress.    Appearance: Normal appearance. She is not ill-appearing.  HENT:     Head: Normocephalic and atraumatic.  Cardiovascular:     Rate and Rhythm: Normal rate and regular rhythm.     Heart sounds: Normal heart sounds.  Pulmonary:     Effort: Pulmonary effort is normal.     Breath sounds: Normal breath sounds.  Abdominal:     General: Bowel sounds are normal. There is no distension.     Palpations: Abdomen is soft. There is no mass.     Tenderness: There is abdominal tenderness in the right lower quadrant and suprapubic area. There is no right CVA tenderness, left CVA tenderness, guarding or rebound. Negative signs include Murphy's sign, Rovsing's sign and McBurney's sign.  Neurological:     General: No focal deficit present.     Mental Status: She is alert and oriented to person, place, and time. Mental status is at baseline.   Psychiatric:        Mood and Affect: Mood normal.        Behavior: Behavior normal.        Thought Content: Thought content normal.        Judgment: Judgment normal.      UC Treatments / Results  Labs (all labs ordered are listed, but only abnormal results are displayed) Labs Reviewed  POCT URINALYSIS DIPSTICK, ED / UC - Abnormal; Notable for the following components:      Result Value   Glucose, UA >=1000 (*)    Ketones, ur 15 (*)    All other components within normal limits  URINE CULTURE  POC URINE PREG, ED  CERVICOVAGINAL ANCILLARY ONLY    EKG   Radiology No results found.  Procedures Procedures (including critical care time)  Medications Ordered in UC Medications - No data to display  Initial Impression / Assessment and Plan / UC Course  I have reviewed the triage vital signs and the nursing notes.  Pertinent labs & imaging results that were available during my care of the patient were reviewed by me and considered in my medical decision making (see chart for details).     Plan to treat for yeast as below: Diflucan and boric acid suppository.  Will send for G/C, trich, yeast, BV testing.  UA wnl, culture sent.  Pt is not pregnant- history hysterotomy.    Final Clinical Impressions(s) / UC Diagnoses   Final diagnoses:  Vaginal candidiasis     Discharge Instructions     -For yeast, take one pill of Diflucan (fluconazole) by mouth today. You can take the second pill in 3 days if your symptoms worsen/persist.  -Also use the boric acid suppositories, insert one a day vaginally for 3 days.  -If your symptoms worsen/persist despite treatment, come back and see [place] or gynecology (information below)   ED Prescriptions    Medication Sig Dispense Auth. Provider   fluconazole (DIFLUCAN) 150 MG tablet Take 1 tablet (150 mg total) by mouth daily. Take 1 pill today. Take the second pill in 3 days if your symptoms return/persist. 2 tablet Rhys Martini, PA-C    Boric Acid Vaginal 600 MG SUPP Place 1 suppository vaginally daily. 3 suppository Rhys Martini, PA-C     PDMP not reviewed this encounter.   Rhys Martini, PA-C 12/10/20 1313

## 2020-12-10 NOTE — ED Triage Notes (Signed)
Pt c/o RLQ pain along with vaginal itching and right lower back pain. Pt states she has been having dysuria and frequency. She states the sxs have been going on x 2 days. She states during the night she woke up to urinate 3-4 times.

## 2020-12-10 NOTE — Discharge Instructions (Addendum)
-  For yeast, take one pill of Diflucan (fluconazole) by mouth today. You can take the second pill in 3 days if your symptoms worsen/persist.  -Also use the boric acid suppositories, insert one a day vaginally for 3 days.  -If your symptoms worsen/persist despite treatment, come back and see [place] or gynecology (information below)

## 2020-12-11 LAB — URINE CULTURE: Culture: 10000 — AB

## 2020-12-13 LAB — CERVICOVAGINAL ANCILLARY ONLY
Bacterial Vaginitis (gardnerella): NEGATIVE
Candida Glabrata: POSITIVE — AB
Candida Vaginitis: POSITIVE — AB
Chlamydia: NEGATIVE
Comment: NEGATIVE
Comment: NEGATIVE
Comment: NEGATIVE
Comment: NEGATIVE
Comment: NEGATIVE
Comment: NORMAL
Neisseria Gonorrhea: NEGATIVE
Trichomonas: NEGATIVE

## 2021-06-21 ENCOUNTER — Ambulatory Visit (HOSPITAL_COMMUNITY)
Admission: EM | Admit: 2021-06-21 | Discharge: 2021-06-21 | Disposition: A | Discharge status: Home / Self Care | Payer: Self-pay | Attending: Physician Assistant | Admitting: Physician Assistant

## 2021-06-21 ENCOUNTER — Encounter (HOSPITAL_COMMUNITY): Payer: Self-pay | Admitting: Emergency Medicine

## 2021-06-21 DIAGNOSIS — R0981 Nasal congestion: Secondary | ICD-10-CM | POA: Insufficient documentation

## 2021-06-21 DIAGNOSIS — J029 Acute pharyngitis, unspecified: Secondary | ICD-10-CM | POA: Insufficient documentation

## 2021-06-21 DIAGNOSIS — J3489 Other specified disorders of nose and nasal sinuses: Secondary | ICD-10-CM | POA: Insufficient documentation

## 2021-06-21 DIAGNOSIS — J069 Acute upper respiratory infection, unspecified: Secondary | ICD-10-CM | POA: Insufficient documentation

## 2021-06-21 DIAGNOSIS — Z20822 Contact with and (suspected) exposure to covid-19: Secondary | ICD-10-CM | POA: Insufficient documentation

## 2021-06-21 DIAGNOSIS — R52 Pain, unspecified: Secondary | ICD-10-CM

## 2021-06-21 MED ORDER — PREDNISONE 10 MG PO TABS: 20.0000 mg | ORAL_TABLET | Freq: Every day | ORAL | 0 refills | Status: AC

## 2021-06-21 MED ORDER — PROMETHAZINE-DM 6.25-15 MG/5ML PO SYRP: 5.0000 mL | ORAL_SOLUTION | Freq: Two times a day (BID) | ORAL | 0 refills | Status: DC | PRN

## 2021-06-21 NOTE — Discharge Instructions (Addendum)
I believe you have a virus.  We will contact you if your COVID-19 test is positive.  Please take prednisone 2 tablets in the morning for 4 days.  This can raise your blood sugar so please make sure you are avoiding carbohydrates and drink plenty of fluid.  Monitor your blood sugar closely and contact us or your primary care provider if persistently above 200.  You can use Promethazine DM twice daily as needed for cough.  This can make you sleepy do not drive or drink alcohol while taking it.  I recommend use over-the-counter medications including Mucinex and Flonase.  Make sure you are drinking plenty of fluid.  If you have any worsening symptoms including high fever, shortness of breath, chest pain, nausea/vomiting preventing you from eating and drinking you need to be seen immediately.

## 2021-06-21 NOTE — ED Triage Notes (Signed)
Pt is present today sinus pressure, body aches, cough, chest congestion, HA, and sore throat. Pt states that her sx started sat morning.

## 2021-06-21 NOTE — ED Provider Notes (Signed)
[facility]    CSN: [id number] Arrival date & time: 06/21/21  1900      History   Chief Complaint Chief Complaint  Patient presents with   Nasal Congestion   Cough   Headache    HPI Lisa Patrick is a 45 y.o. female.   Patient presents today with a 4-day history of URI symptoms.  Reports symptoms began as nasal congestion and sinus pressure but have progressed to include sore throat, cough, burning sensation in her chest, chest tightness.  She denies any chest pain, nausea, vomiting.  She is experiencing severe body aches that are preventing her from sleeping.  She has tried Benadryl as well as [place] medication without improvement of symptoms.  Denies any known sick contacts.  She does have a history of bronchitis but denies history of asthma, COPD, smoking.  She has not required albuterol since symptoms began.  She denies any recent antibiotic use.  She does have a history of diabetes but reports her blood sugars are very well controlled.   Past Medical History:  Diagnosis Date   Diabetes mellitus without complication (HCC)    Hypercholesteremia    Pneumonia 2013    There are no problems to display for this patient.   Past Surgical History:  Procedure Laterality Date   TUBAL LIGATION  2004    OB History   No obstetric history on file.      Home Medications    Prior to Admission medications   Medication Sig Start Date End Date Taking? Authorizing Provider  predniSONE (DELTASONE) 10 MG tablet Take 2 tablets (20 mg total) by mouth daily for 4 days. 06/21/21 06/25/21 Yes Reshma Hoey K, PA-C  promethazine-dextromethorphan (PROMETHAZINE-DM) 6.25-15 MG/5ML syrup Take 5 mLs by mouth 2 (two) times daily as needed for cough. 06/21/21  Yes Alec Mcphee K, PA-C  albuterol (PROVENTIL HFA;VENTOLIN HFA) 108 (90 Base) MCG/ACT inhaler Inhale 2 puffs into the lungs every 4 (four) hours as needed for wheezing or shortness of breath. Dispense with aerochamber 10/24/16    Domenick Gong, MD  Boric Acid Vaginal 600 MG SUPP Place 1 suppository vaginally daily. 12/10/20   Rhys Martini, PA-C  Cetirizine HCl (ZYRTEC ALLERGY) 10 MG CAPS Take 1 capsule (10 mg total) by mouth daily. 10/04/12   [name], MD  fluconazole (DIFLUCAN) 150 MG tablet Take 1 tablet (150 mg total) by mouth daily. Take 1 pill today. Take the second pill in 3 days if your symptoms return/persist. 12/10/20   Rhys Martini, PA-C  fluticasone [facility]) 50 MCG/ACT nasal spray Place 2 sprays into both nostrils at bedtime. 08/10/14   [name], MD  ipratropium (ATROVENT) 0.06 % nasal spray Place 2 sprays into both nostrils 4 (four) times daily. 08/10/14   [name], MD  rosuvastatin (CRESTOR) 10 MG tablet Take 10 mg by mouth daily.    [provider]  sitaGLIPtan-metformin (JANUMET) 50-1000 MG per tablet Take 1 tablet by mouth 2 (two) times daily with a meal.    [provider]  Spacer/Aero-Holding Chambers (AEROCHAMBER PLUS) inhaler Use as instructed 10/24/16   Domenick Gong, MD    Family History History reviewed. No pertinent family history.  Social History Social History   Tobacco Use   Smoking status: Never   Smokeless tobacco: Never  Substance Use Topics   Alcohol use: No   Drug use: No     Allergies   Patient has no known allergies.   Review of Systems Review  of Systems  Constitutional:  Positive for activity change and fatigue. Negative for appetite change and fever.  HENT:  Positive for congestion, postnasal drip, sinus pressure and sore throat. Negative for sneezing.   Respiratory:  Positive for cough and chest tightness. Negative for shortness of breath and wheezing.   Cardiovascular:  Negative for chest pain.  Gastrointestinal:  Negative for abdominal pain, diarrhea, nausea and vomiting.  Musculoskeletal:  Positive for arthralgias and myalgias.  Neurological:  Positive for headaches. Negative for dizziness and light-headedness.     Physical Exam Triage Vital Signs ED Triage Vitals  Enc Vitals Group     BP 06/21/21 1936 133/66     Pulse Rate 06/21/21 1936 (!) 108     Resp 06/21/21 1936 18     Temp 06/21/21 1936 99.3 F (37.4 C)     Temp Source 06/21/21 1936 Oral     SpO2 06/21/21 1936 96 %     Weight --      Height --      Head Circumference --      Peak Flow --      Pain Score 06/21/21 1934 10     Pain Loc --      Pain Edu? --      Excl. in GC? --    No data found.  Updated Vital Signs BP 133/66   Pulse (!) 108   Temp 99.3 F (37.4 C) (Oral)   Resp 18   SpO2 96%   Visual Acuity Right Eye Distance:   Left Eye Distance:   Bilateral Distance:    Right Eye Near:   Left Eye Near:    Bilateral Near:     Physical Exam Vitals reviewed.  Constitutional:      General: She is awake. She is not in acute distress.    Appearance: Normal appearance. She is normal weight. She is not ill-appearing.     Comments: Very pleasant female appears stated age in no acute distress sitting comfortably in exam room  HENT:     Head: Normocephalic and atraumatic.     Right Ear: Tympanic membrane, ear canal and external ear normal. Tympanic membrane is not erythematous or bulging.     Left Ear: Tympanic membrane, ear canal and external ear normal. Tympanic membrane is not erythematous or bulging.     Nose:     Right Sinus: Maxillary sinus tenderness present. No frontal sinus tenderness.     Left Sinus: Maxillary sinus tenderness present. No frontal sinus tenderness.     Mouth/Throat:     Pharynx: Uvula midline. Posterior oropharyngeal erythema present. No oropharyngeal exudate.     Comments: Moderate erythema and drainage in posterior oropharynx Cardiovascular:     Rate and Rhythm: Normal rate and regular rhythm.     Heart sounds: Normal heart sounds, S1 normal and S2 normal. No murmur heard. Pulmonary:     Effort: Pulmonary effort is normal.     Breath sounds: Normal breath sounds. No wheezing, rhonchi or  rales.     Comments: Clear to auscultation bilaterally; reactive cough with deep breathing Lymphadenopathy:     Head:     Right side of head: No submental, submandibular or tonsillar adenopathy.     Left side of head: No submental, submandibular or tonsillar adenopathy.     Cervical: No cervical adenopathy.  Psychiatric:        Behavior: Behavior is cooperative.     UC Treatments / Results  Labs (all labs ordered are  listed, but only abnormal results are displayed) Labs Reviewed  SARS CORONAVIRUS 2 (TAT 6-24 HRS)    EKG   Radiology No results found.  Procedures Procedures (including critical care time)  Medications Ordered in UC Medications - No data to display  Initial Impression / Assessment and Plan / UC Course  I have reviewed the triage vital signs and the nursing notes.  Pertinent labs & imaging results that were available during my care of the patient were reviewed by me and considered in my medical decision making (see chart for details).      Suspect viral etiology given short duration of symptoms.  No indication for influenza testing given patient has been symptomatic for more than 48 to 72 hours.  COVID test is pending.  She was prescribed low-dose prednisone to help manage symptoms we discussed that this can raise her blood sugar.  She is to avoid carbohydrates and drink plenty of fluid while on this medication and monitor her blood sugar closely.  She was prescribed meclizine DM to be used up to twice a day as needed for cough.  I recommended she use over-the-counter medications including Mucinex and Flonase.  She is to rest and drink plenty of fluid.  We discussed alarm symptoms that warrant emergent evaluation.  Strict return precautions given to which patient expressed understanding.  Final Clinical Impressions(s) / UC Diagnoses   Final diagnoses:  Viral URI with cough  Sore throat  Sinus pressure  Nasal congestion  Body aches     Discharge  Instructions      I believe you have a virus.  We will contact you if your COVID-19 test is positive.  Please take prednisone 2 tablets in the morning for 4 days.  This can raise your blood sugar so please make sure you are avoiding carbohydrates and drink plenty of fluid.  Monitor your blood sugar closely and contact us or your primary care provider if persistently above 200.  You can use Promethazine DM twice daily as needed for cough.  This can make you sleepy do not drive or drink alcohol while taking it.  I recommend use over-the-counter medications including Mucinex and Flonase.  Make sure you are drinking plenty of fluid.  If you have any worsening symptoms including high fever, shortness of breath, chest pain, nausea/vomiting preventing you from eating and drinking you need to be seen immediately.     ED Prescriptions     Medication Sig Dispense Auth. Provider   predniSONE (DELTASONE) 10 MG tablet Take 2 tablets (20 mg total) by mouth daily for 4 days. 8 tablet Reshad Saab K, PA-C   promethazine-dextromethorphan (PROMETHAZINE-DM) 6.25-15 MG/5ML syrup Take 5 mLs by mouth 2 (two) times daily as needed for cough. 118 mL Advait Buice K, PA-C      PDMP not reviewed this encounter.   Jeani Hawking, PA-C 06/21/21 1954

## 2021-06-22 LAB — SARS CORONAVIRUS 2 (TAT 6-24 HRS): SARS Coronavirus 2: NEGATIVE

## 2021-12-10 ENCOUNTER — Other Ambulatory Visit: Payer: Self-pay

## 2021-12-10 ENCOUNTER — Encounter (HOSPITAL_COMMUNITY): Payer: Self-pay | Admitting: Emergency Medicine

## 2021-12-10 ENCOUNTER — Ambulatory Visit (HOSPITAL_COMMUNITY)
Admission: EM | Admit: 2021-12-10 | Discharge: 2021-12-10 | Disposition: A | Discharge status: Home / Self Care | Payer: Self-pay

## 2021-12-10 DIAGNOSIS — Z9851 Tubal ligation status: Secondary | ICD-10-CM

## 2021-12-10 DIAGNOSIS — R1030 Lower abdominal pain, unspecified: Secondary | ICD-10-CM

## 2021-12-10 NOTE — ED Provider Notes (Addendum)
[place]    CSN: [id number] Arrival date & time: 12/10/21  1700      History   Chief Complaint Chief Complaint  Patient presents with   Abdominal Pain    HPI Lisa Patrick is a 46 y.o. female presenting with abdominal pain. Medical history tubal ligation 20 years ago.  Describes about 12 hours of lower abdominal pain radiating to the lower back.  States it is sharp and crampy, and worse in the lower areas of her back.  It is associated with a hard bloody stool that occurred about 8 hours ago.  Decreased appetite but without nausea or vomiting.  She denies other symptoms like cough, congestion.  Denies pain with defecation.  Denies prior history of abdominal procedures or surgeries.  Denies history of ovarian cyst, colitis, etc. Denies urinary symptoms today.  HPI  Past Medical History:  Diagnosis Date   Diabetes mellitus without complication ([place])    Hypercholesteremia    Pneumonia 2013    There are no problems to display for this patient.   Past Surgical History:  Procedure Laterality Date   TUBAL LIGATION  2004    OB History   No obstetric history on file.      Home Medications    Prior to Admission medications   Medication Sig Start Date End Date Taking? Authorizing Provider  albuterol (PROVENTIL HFA;VENTOLIN HFA) 108 (90 Base) MCG/ACT inhaler Inhale 2 puffs into the lungs every 4 (four) hours as needed for wheezing or shortness of breath. Dispense with aerochamber 10/24/16   Melynda Ripple, MD  Boric Acid Vaginal 600 MG SUPP Place 1 suppository vaginally daily. 12/10/20   Hazel Sams, PA-C  Cetirizine HCl (ZYRTEC ALLERGY) 10 MG CAPS Take 1 capsule (10 mg total) by mouth daily. Patient not taking: Reported on 12/10/2021 10/04/12   Rosana Hoes, MD  fluconazole (DIFLUCAN) 150 MG tablet Take 1 tablet (150 mg total) by mouth daily. Take 1 pill today. Take the second pill in 3 days if your symptoms return/persist. Patient not taking: Reported on  12/10/2021 12/10/20   Hazel Sams, PA-C  fluticasone [facility]) 50 MCG/ACT nasal spray Place 2 sprays into both nostrils at bedtime. Patient not taking: Reported on 12/10/2021 08/10/14   Waldemar Dickens, MD  ipratropium (ATROVENT) 0.06 % nasal spray Place 2 sprays into both nostrils 4 (four) times daily. Patient not taking: Reported on 12/10/2021 08/10/14   Waldemar Dickens, MD  promethazine-dextromethorphan (PROMETHAZINE-DM) 6.25-15 MG/5ML syrup Take 5 mLs by mouth 2 (two) times daily as needed for cough. Patient not taking: Reported on 12/10/2021 06/21/21   Raspet, Junie Panning K, PA-C  rosuvastatin (CRESTOR) 10 MG tablet Take 10 mg by mouth daily. Patient not taking: Reported on 12/10/2021    [provider]  sitaGLIPtan-metformin (JANUMET) 50-1000 MG per tablet Take 1 tablet by mouth 2 (two) times daily with a meal.    [provider]  Spacer/Aero-Holding Chambers (AEROCHAMBER PLUS) inhaler Use as instructed 10/24/16   Melynda Ripple, MD    Family History Family History  Problem Relation Age of Onset   Diabetes Mother    Pancreatitis Mother     Social History Social History   Tobacco Use   Smoking status: Never   Smokeless tobacco: Never  Vaping Use   Vaping Use: Never used  Substance Use Topics   Alcohol use: No   Drug use: No     Allergies   Patient has no known allergies.   Review of Systems  Review of Systems  Constitutional:  Negative for appetite change, chills, diaphoresis, fever and unexpected weight change.  HENT:  Negative for congestion, ear pain, sinus pressure, sinus pain, sneezing, sore throat and trouble swallowing.   Respiratory:  Negative for cough, chest tightness and shortness of breath.   Cardiovascular:  Negative for chest pain.  Gastrointestinal:  Positive for abdominal pain. Negative for abdominal distention, anal bleeding, blood in stool, constipation, diarrhea, nausea, rectal pain and vomiting.  Genitourinary:  Negative for dysuria,  flank pain, frequency and urgency.  Musculoskeletal:  Negative for back pain and myalgias.  Neurological:  Negative for dizziness, light-headedness and headaches.  All other systems reviewed and are negative.   Physical Exam Triage Vital Signs ED Triage Vitals  Enc Vitals Group     BP      Pulse      Resp      Temp      Temp src      SpO2      Weight      Height      Head Circumference      Peak Flow      Pain Score      Pain Loc      Pain Edu?      Excl. in [place]?    No data found.  Updated Vital Signs BP 136/63 (BP Location: Right Arm)    Pulse 89    Temp 100.2 F (37.9 C) (Oral)    Resp 18    LMP 11/30/2021    SpO2 100%   Visual Acuity Right Eye Distance:   Left Eye Distance:   Bilateral Distance:    Right Eye Near:   Left Eye Near:    Bilateral Near:     Physical Exam Vitals reviewed.  Constitutional:      General: She is not in acute distress.    Appearance: Normal appearance. She is not ill-appearing.  HENT:     Head: Normocephalic and atraumatic.     Mouth/Throat:     Mouth: Mucous membranes are moist.     Comments: Moist mucous membranes Eyes:     Extraocular Movements: Extraocular movements intact.     Pupils: Pupils are equal, round, and reactive to light.  Cardiovascular:     Rate and Rhythm: Normal rate and regular rhythm.     Heart sounds: Normal heart sounds.  Pulmonary:     Effort: Pulmonary effort is normal.     Breath sounds: Normal breath sounds. No wheezing, rhonchi or rales.  Abdominal:     General: Bowel sounds are normal. There is no distension.     Palpations: Abdomen is soft. There is no mass.     Tenderness: There is abdominal tenderness in the right lower quadrant, periumbilical area and left lower quadrant. There is no right CVA tenderness, left CVA tenderness, guarding or rebound.     Comments: Significant right, left lower quadrant pain.  Significant periumbilical pain.  No obvious mass or hernia.  Patient comfortable throughout  exam.  Skin:    General: Skin is warm.     Capillary Refill: Capillary refill takes less than 2 seconds.     Comments: Good skin turgor  Neurological:     General: No focal deficit present.     Mental Status: She is alert and oriented to person, place, and time.  Psychiatric:        Mood and Affect: Mood normal. Affect is tearful.  Behavior: Behavior normal.     UC Treatments / Results  Labs (all labs ordered are listed, but only abnormal results are displayed) Labs Reviewed - No data to display  EKG   Radiology No results found.  Procedures Procedures (including critical care time)  Medications Ordered in UC Medications - No data to display  Initial Impression / Assessment and Plan / UC Course  I have reviewed the triage vital signs and the nursing notes.  Pertinent labs & imaging results that were available during my care of the patient were reviewed by me and considered in my medical decision making (see chart for details).     This patient is a very pleasant 46 y.o. year old female presenting with severe abdominal pain.  She is borderline febrile but nontachycardic.  Prior history of tubal ligation, no other abdominal surgeries per patient.  She is in significant pain on exam, and appears uncomfortable at rest.  There is also blood in her stool.  Sent to the emergency department via personal vehicle, I have concern for hernia versus appendicitis versus colitis versus other.  Level 5 as she was admitted for renal stone and emergent cytoscopy /stent.   Final Clinical Impressions(s) / UC Diagnoses   Final diagnoses:  Lower abdominal pain  History of tubal ligation     Discharge Instructions      -Please head to the emergency department for further evaluation and management of your abdominal pain.  I am concerned about your internal organs, including your appendix, colon.  You will probably need a CT scan.     ED Prescriptions   None    PDMP not  reviewed this encounter.   Hazel Sams, PA-C 12/10/21 [place], PA-C 12/13/21 [phone]

## 2021-12-10 NOTE — ED Triage Notes (Signed)
Patient has lower abdominal pain radiating around to the lower back.  Denies painful urination.  Last bm today, questionable blood streaks in stool and stool was hard

## 2021-12-10 NOTE — Discharge Instructions (Addendum)
-  Please head to the emergency department for further evaluation and management of your abdominal pain.  I am concerned about your internal organs, including your appendix, colon.  You will probably need a CT scan.

## 2022-05-30 ENCOUNTER — Ambulatory Visit
Admission: RE | Admit: 2022-05-30 | Discharge: 2022-05-30 | Disposition: A | Discharge status: Home / Self Care | Payer: BLUE CROSS/BLUE SHIELD | Source: Ambulatory Visit | Attending: Internal Medicine | Admitting: Internal Medicine

## 2022-05-30 ENCOUNTER — Ambulatory Visit: Payer: BLUE CROSS/BLUE SHIELD | Attending: Internal Medicine | Admitting: Internal Medicine

## 2022-05-30 ENCOUNTER — Encounter: Payer: Self-pay | Admitting: Internal Medicine

## 2022-05-30 VITALS — BP 129/63 | HR 104 | Temp 98.0°F | Ht 62.0 in | Wt 173.0 lb

## 2022-05-30 DIAGNOSIS — M533 Sacrococcygeal disorders, not elsewhere classified: Secondary | ICD-10-CM

## 2022-05-30 DIAGNOSIS — F419 Anxiety disorder, unspecified: Secondary | ICD-10-CM

## 2022-05-30 DIAGNOSIS — E785 Hyperlipidemia, unspecified: Secondary | ICD-10-CM

## 2022-05-30 DIAGNOSIS — D649 Anemia, unspecified: Secondary | ICD-10-CM | POA: Insufficient documentation

## 2022-05-30 DIAGNOSIS — Z7689 Persons encountering health services in other specified circumstances: Secondary | ICD-10-CM

## 2022-05-30 DIAGNOSIS — E669 Obesity, unspecified: Secondary | ICD-10-CM

## 2022-05-30 DIAGNOSIS — D508 Other iron deficiency anemias: Secondary | ICD-10-CM

## 2022-05-30 DIAGNOSIS — E1169 Type 2 diabetes mellitus with other specified complication: Secondary | ICD-10-CM | POA: Diagnosis not present

## 2022-05-30 DIAGNOSIS — F320 Major depressive disorder, single episode, mild: Secondary | ICD-10-CM

## 2022-05-30 DIAGNOSIS — G4709 Other insomnia: Secondary | ICD-10-CM | POA: Diagnosis not present

## 2022-05-30 MED ORDER — ROSUVASTATIN CALCIUM 10 MG PO TABS: 10.0000 mg | ORAL_TABLET | Freq: Every day | ORAL | 5 refills | Status: DC

## 2022-05-30 MED ORDER — SITAGLIPTIN PHOS-METFORMIN HCL 50-1000 MG PO TABS: 1.0000 | ORAL_TABLET | Freq: Two times a day (BID) | ORAL | 6 refills | Status: AC

## 2022-05-30 MED ORDER — LISINOPRIL 5 MG PO TABS: 5.0000 mg | ORAL_TABLET | Freq: Every day | ORAL | 1 refills | Status: AC

## 2022-05-30 MED ORDER — EMPAGLIFLOZIN 10 MG PO TABS: 10.0000 mg | ORAL_TABLET | Freq: Every day | ORAL | 4 refills | Status: AC

## 2022-05-30 MED ORDER — HYDROXYZINE PAMOATE 25 MG PO CAPS: 25.0000 mg | ORAL_CAPSULE | Freq: Every day | ORAL | 2 refills | Status: DC

## 2022-05-30 NOTE — Patient Instructions (Signed)
Stop melatonin. We have started you on a another medication called hydroxyzine 25 mg at bedtime instead. Stop downstairs in radiology department to get the x-ray done of the tailbone.  Continue Janumet.  We have added another diabetes medication called Jardiance 10 mg 1 tablet daily.  Continue to monitor your blood sugars daily with goal being 90-130 before meals and less than 182 hours after meals.

## 2022-05-30 NOTE — Progress Notes (Signed)
New pt- Establish care

## 2022-05-30 NOTE — Progress Notes (Signed)
Patient ID: Lisa Patrick, female    DOB: 05-06-1976  MRN: [id number]  CC: Establish Care   Subjective: Lisa Patrick is a 46 y.o. female who presents for new patient visit.  Daughter is with her Her concerns today include:  Patient with history of DM with microalbumin, HL, glaucoma, renal cell CA status post partial left nephrectomy 02/2022, former smoker, IDA, urolithiasis with JP stent placed 01/11/2022 (70% struvite/30% carbonate apatite)  Previous PCP was at [facility].  Her parents are under my care and she decided she wanted to be too.  DM: Checks BS Q a.m.  Range 109-159 Taking Janument 50/1000 mg BID  Doing well with eating habits Plan to start exercising at gym, has membership Taking and tolerating Crestor for cholesterol.  Last LDL 158 ([facility] earlier this yr)  "I can't sleep" for past 6 mths Gets in bed at 9:30 p.m; watches the news until around 10:30 p.m.  Use to sleep with TV on but stopped several months ago. Use to drink a cup of coffee around 8 p.m, stopped mths ago.  Unable to fall asleep until 12 a.m and then up again around 3 a.m and unable to fall back to sleep Used an eye mask last night for 1st time and found she slept the entire night Melatonin helped at first but not any more.  Had also used Benadryl 2 tabs at bedtime.  Helped some.    Pain in tailbone x 3 mths. Feels it when she is sitting and about to get up from sitting.   No initiating factors or trauma  Depression and GAD7 positive: not feel like doing anything since surgery on her kidney earlier this year.  She feels anxiety all the time.  It has been noted by her spouse as well.   Feels tired a lot.  History of iron deficiency anemia.  I see that last H/H was 8/24.6 and iron studies were consistent with iron deficiency anemia.  Iron level was 37, ferritin 41, iron saturation 11% and TIBC 345.  Information was reviewed on Care Everywhere. Current Outpatient Medications on File Prior to Visit  Medication Sig  Dispense Refill   Ascorbic Acid (VITAMIN C) 100 MG tablet Take 100 mg by mouth daily.     lisinopril (ZESTRIL) 5 MG tablet Take 5 mg by mouth daily.     Melatonin 1 MG CAPS Take by mouth.     Multiple Vitamin (MULTIVITAMIN) tablet Take 1 tablet by mouth daily.     sitaGLIPtan-metformin (JANUMET) 50-1000 MG per tablet Take 1 tablet by mouth 2 (two) times daily with a meal.     rosuvastatin (CRESTOR) 10 MG tablet Take 10 mg by mouth daily. (Patient not taking: Reported on 12/10/2021)     Spacer/Aero-Holding Chambers (AEROCHAMBER PLUS) inhaler Use as instructed (Patient not taking: Reported on 05/30/2022) 1 each 2   No current facility-administered medications on file prior to visit.    No Known Allergies  Social History   Socioeconomic History   Marital status: Single    Spouse name: Not on file   Number of children: Not on file   Years of education: Not on file   Highest education level: Not on file  Occupational History   Not on file  Tobacco Use   Smoking status: Never   Smokeless tobacco: Never  Vaping Use   Vaping Use: Never used  Substance and Sexual Activity   Alcohol use: No   Drug use: No   Sexual activity: Not  Currently  Other Topics Concern   Not on file  Social History Narrative   ** Merged History Encounter **       Social Determinants of Health   Financial Resource Strain: Not on file  Food Insecurity: Not on file  Transportation Needs: Not on file  Physical Activity: Not on file  Stress: Not on file  Social Connections: Not on file  Intimate Partner Violence: Not on file    Family History  Problem Relation Age of Onset   Diabetes Mother    Pancreatitis Mother     Past Surgical History:  Procedure Laterality Date   TUBAL LIGATION  2004    ROS: Review of Systems Negative except as stated above  PHYSICAL EXAM: BP 129/63 (BP Location: Left Arm, Patient Position: Sitting, Cuff Size: Normal)   Pulse (!) 104   Temp 98 F (36.7 C) (Oral)   Ht  5\' 2"  (1.575 m)   Wt 173 lb (78.5 kg)   LMP 05/29/2022 (Exact Date)   SpO2 97%   BMI 31.64 kg/m    Physical Exam  General appearance -alert well-appearing obese middle-age Hispanic female in NAD Mental status - normal mood, behavior, speech, dress, motor activity, and thought processes Eyes - pupils equal and reactive, extraocular eye movements intact Mouth - mucous membranes moist, pharynx normal without lesions Chest - clear to auscultation, no wheezes, rales or rhonchi, symmetric air entry Heart -regular rate and rhythm with occasional ectopy Extremities - peripheral pulses normal, no pedal edema, no clubbing or cyanosis MSK: Mild tenderness on palpation of the sacrum/coccyx bone.    05/30/2022    2:41 PM  Depression screen PHQ 2/9  Decreased Interest 2  Down, Depressed, Hopeless 2  PHQ - 2 Score 4  Altered sleeping 2  Tired, decreased energy 3  Change in appetite 0  Feeling bad or failure about yourself  2  Trouble concentrating 0  Moving slowly or fidgety/restless 3  Suicidal thoughts 0  PHQ-9 Score 14      05/30/2022    2:41 PM  GAD 7 : Generalized Anxiety Score  Nervous, Anxious, on Edge 3  Control/stop worrying 3  Worry too much - different things 3  Trouble relaxing 3  Restless 3  Easily annoyed or irritable 3  Afraid - awful might happen 3  Total GAD 7 Score 21         No data to display         ASSESSMENT AND PLAN: 1. Encounter to establish care   2. Type 2 diabetes mellitus with obesity (HCC) Not at goal.  Continue Janumet 50/1 g twice a day.  Add Jardiance 10 mg daily.  Advised to stay hydrated while on this medication.  She is on lisinopril for renal protection. Discussed and encourage healthy eating habits. Encouraged her to start her exercise program with goal of getting in about 150 minutes/week total of moderate intensity exercise - CBC - empagliflozin (JARDIANCE) 10 MG TABS tablet; Take 1 tablet (10 mg total) by mouth daily before  breakfast.  Dispense: 30 tablet; Refill: 4 - Microalbumin / creatinine urine ratio - Comprehensive metabolic panel - Lipid panel  3. Other insomnia Good sleep hygiene discussed and encouraged. Patient advised not to drink any caffeinated beverages or excessive alcohol use within several hours of bedtime.  Advised to get in bed around about the same time every night.  Once in bed, turn off all lights and sounds.  If unable to fall asleep within 30  to 45 minutes of getting in bed, patient should get up and try to do something until she feels sleepy again.  At that time try getting back in bed. -We will give a trial of hydroxyzine to help with insomnia and anxiety. - hydrOXYzine (VISTARIL) 25 MG capsule; Take 1 capsule (25 mg total) by mouth at bedtime.  Dispense: 30 capsule; Refill: 2  4. Tail bone pain We will start with an x-ray - DG Sacrum/Coccyx; Future  5. Hyperlipidemia due to type 2 diabetes mellitus (HCC) Continue Crestor 10 mg daily.  Check lipid profile today  6. Anxiety Discussed management of depression and anxiety.  She declines referral to behavioral health at this time.  Willing to try hydroxyzine at bedtime. - hydrOXYzine (VISTARIL) 25 MG capsule; Take 1 capsule (25 mg total) by mouth at bedtime.  Dispense: 30 capsule; Refill: 2  7. Major depressive disorder, single episode, mild (HCC) See #6 above.  8. Other iron deficiency anemia Recheck CBC today to see if hemoglobin has improved.  If not we will get her on iron supplement daily.   Patient was given the opportunity to ask questions.  Patient verbalized understanding of the plan and was able to repeat key elements of the plan.   This documentation was completed using Paediatric nurse.  Any transcriptional errors are unintentional.  No orders of the defined types were placed in this encounter.    Requested Prescriptions    No prescriptions requested or ordered in this encounter    No  follow-ups on file.  Jonah Blue, MD, FACP

## 2022-06-01 LAB — COMPREHENSIVE METABOLIC PANEL
ALT: 44 IU/L — ABNORMAL HIGH (ref 0–32)
AST: 28 IU/L (ref 0–40)
Albumin/Globulin Ratio: 1.6 (ref 1.2–2.2)
Albumin: 4.6 g/dL (ref 3.9–4.9)
Alkaline Phosphatase: 88 IU/L (ref 44–121)
BUN/Creatinine Ratio: 12 (ref 9–23)
BUN: 7 mg/dL (ref 6–24)
Bilirubin Total: <0.2 mg/dL (ref 0.0–1.2)
CO2: 22 mmol/L (ref 20–29)
Calcium: 9.3 mg/dL (ref 8.7–10.2)
Chloride: 100 mmol/L (ref 96–106)
Creatinine, Ser: 0.58 mg/dL (ref 0.57–1.00)
Globulin, Total: 2.9 g/dL (ref 1.5–4.5)
Glucose: 209 mg/dL — ABNORMAL HIGH (ref 70–99)
Potassium: 4.7 mmol/L (ref 3.5–5.2)
Sodium: 140 mmol/L (ref 134–144)
Total Protein: 7.5 g/dL (ref 6.0–8.5)
eGFR: 114 mL/min/{1.73_m2} (ref >59)

## 2022-06-01 LAB — CBC
Hematocrit: 35.6 % (ref 34.0–46.6)
Hemoglobin: 11.6 g/dL (ref 11.1–15.9)
MCH: 27.4 pg (ref 26.6–33.0)
MCHC: 32.6 g/dL (ref 31.5–35.7)
MCV: 84 fL (ref 79–97)
Platelets: 333 10*3/uL (ref 150–450)
RBC: 4.23 x10E6/uL (ref 3.77–5.28)
RDW: 12.6 % (ref 11.7–15.4)
WBC: 6.7 10*3/uL (ref 3.4–10.8)

## 2022-06-01 LAB — MICROALBUMIN / CREATININE URINE RATIO
Creatinine, Urine: 112.7 mg/dL
Microalb/Creat Ratio: 23 mg/g creat (ref 0–29)
Microalbumin, Urine: 25.5 ug/mL

## 2022-06-01 LAB — LIPID PANEL
Chol/HDL Ratio: 7.1 ratio — ABNORMAL HIGH (ref 0.0–4.4)
Cholesterol, Total: 220 mg/dL — ABNORMAL HIGH (ref 100–199)
HDL: 31 mg/dL — ABNORMAL LOW (ref >39)
LDL Chol Calc (NIH): 137 mg/dL — ABNORMAL HIGH (ref 0–99)
Triglycerides: 285 mg/dL — ABNORMAL HIGH (ref 0–149)
VLDL Cholesterol Cal: 52 mg/dL — ABNORMAL HIGH (ref 5–40)

## 2022-07-20 ENCOUNTER — Ambulatory Visit: Payer: Self-pay | Admitting: Internal Medicine

## 2022-08-14 ENCOUNTER — Ambulatory Visit: Payer: BLUE CROSS/BLUE SHIELD | Admitting: Internal Medicine

## 2022-08-16 ENCOUNTER — Other Ambulatory Visit: Payer: Self-pay | Admitting: Internal Medicine

## 2022-08-16 DIAGNOSIS — G4709 Other insomnia: Secondary | ICD-10-CM

## 2022-08-16 DIAGNOSIS — F419 Anxiety disorder, unspecified: Secondary | ICD-10-CM

## 2022-09-13 ENCOUNTER — Other Ambulatory Visit: Payer: Self-pay | Admitting: Internal Medicine

## 2022-09-13 DIAGNOSIS — G4709 Other insomnia: Secondary | ICD-10-CM

## 2022-09-13 DIAGNOSIS — F419 Anxiety disorder, unspecified: Secondary | ICD-10-CM

## 2022-11-13 ENCOUNTER — Other Ambulatory Visit: Payer: Self-pay | Admitting: Internal Medicine

## 2022-11-13 DIAGNOSIS — E1169 Type 2 diabetes mellitus with other specified complication: Secondary | ICD-10-CM

## 2022-11-14 NOTE — Telephone Encounter (Signed)
Requested Prescriptions  Pending Prescriptions Disp Refills   rosuvastatin (CRESTOR) 10 MG tablet [Pharmacy Med Name: Rosuvastatin Calcium 10 MG Oral Tablet] 90 tablet 0    Sig: Take 1 tablet by mouth once daily     Cardiovascular:  Antilipid - Statins 2 Failed - 11/13/2022  9:25 AM      Failed - Lipid Panel in normal range within the last 12 months    Cholesterol, Total  Date Value Ref Range Status  05/30/2022 220 (H) 100 - 199 mg/dL Final   LDL Chol Calc (NIH)  Date Value Ref Range Status  05/30/2022 137 (H) 0 - 99 mg/dL Final   HDL  Date Value Ref Range Status  05/30/2022 31 (L) >39 mg/dL Final   Triglycerides  Date Value Ref Range Status  05/30/2022 285 (H) 0 - 149 mg/dL Final         Passed - Cr in normal range and within 360 days    Creatinine, Ser  Date Value Ref Range Status  05/30/2022 0.58 0.57 - 1.00 mg/dL Final         Passed - Patient is not pregnant      Passed - Valid encounter within last 12 months    Recent Outpatient Visits           5 months ago Encounter to establish care   [facility] Ladell Pier, MD

## 2023-02-18 ENCOUNTER — Other Ambulatory Visit: Payer: Self-pay | Admitting: Internal Medicine

## 2023-02-18 DIAGNOSIS — E1169 Type 2 diabetes mellitus with other specified complication: Secondary | ICD-10-CM
